# Patient Record
Sex: Female | Born: 1980 | Race: White | Hispanic: No | Marital: Married | Smoking: Never smoker
Health system: Southern US, Community
[De-identification: ages and names within clinical notes are randomized; demographics above are authoritative.]

## PROBLEM LIST (undated history)

## (undated) DIAGNOSIS — Z8619 Personal history of other infectious and parasitic diseases: Secondary | ICD-10-CM

## (undated) DIAGNOSIS — Z789 Other specified health status: Secondary | ICD-10-CM

## (undated) HISTORY — DX: Personal history of other infectious and parasitic diseases: Z86.19

## (undated) HISTORY — PX: WISDOM TOOTH EXTRACTION: SHX21

## (undated) HISTORY — PX: TONSILLECTOMY: SUR1361

---

## 2002-06-25 ENCOUNTER — Other Ambulatory Visit: Admission: RE | Admit: 2002-06-25 | Discharge: 2002-06-25 | Payer: Self-pay | Admitting: Obstetrics and Gynecology

## 2003-08-09 ENCOUNTER — Other Ambulatory Visit: Admission: RE | Admit: 2003-08-09 | Discharge: 2003-08-09 | Payer: Self-pay | Admitting: Obstetrics and Gynecology

## 2004-08-18 ENCOUNTER — Other Ambulatory Visit: Admission: RE | Admit: 2004-08-18 | Discharge: 2004-08-18 | Payer: Self-pay | Admitting: Obstetrics and Gynecology

## 2006-08-31 ENCOUNTER — Other Ambulatory Visit: Admission: RE | Admit: 2006-08-31 | Discharge: 2006-08-31 | Payer: Self-pay | Admitting: Obstetrics and Gynecology

## 2008-02-02 ENCOUNTER — Other Ambulatory Visit: Admission: RE | Admit: 2008-02-02 | Discharge: 2008-02-02 | Payer: Self-pay | Admitting: Obstetrics and Gynecology

## 2009-10-03 ENCOUNTER — Other Ambulatory Visit: Admission: RE | Admit: 2009-10-03 | Discharge: 2009-10-03 | Payer: Self-pay | Admitting: Obstetrics and Gynecology

## 2009-10-08 ENCOUNTER — Encounter: Admission: RE | Admit: 2009-10-08 | Discharge: 2009-10-08 | Payer: Self-pay | Admitting: Obstetrics and Gynecology

## 2010-01-18 NOTE — L&D Delivery Note (Signed)
Delivery Note At 7:42 PM a viable female was delivered via Vaginal, Spontaneous Delivery (Presentation: ; Occiput Anterior).  APGAR: 9, 9; weight 7 lb 5.5 oz (3330 g).   Placenta status: Intact, Spontaneous, marginal insertion of cord.  Cord: 3 vessels with the following complications: None.  Cord pH: N/a Nuchal cord, manually reduced.  Anesthesia: Epidural  Episiotomy: None Lacerations: 2nd degree;Perineal.  Vaginal avulsion 3 cm Suture Repair: 2.0 3.0 chromic Est. Blood Loss (mL): 200  Mom to postpartum.  Baby to skin to skin.  Geryl Rankins 01/15/2011, 8:20 PM

## 2010-05-13 ENCOUNTER — Inpatient Hospital Stay (HOSPITAL_COMMUNITY): Payer: PRIVATE HEALTH INSURANCE

## 2010-05-13 ENCOUNTER — Inpatient Hospital Stay (HOSPITAL_COMMUNITY)
Admission: AD | Admit: 2010-05-13 | Discharge: 2010-05-13 | Disposition: A | Discharge status: Home / Self Care | Payer: PRIVATE HEALTH INSURANCE | Source: Ambulatory Visit | Attending: Obstetrics and Gynecology | Admitting: Obstetrics and Gynecology

## 2010-05-13 DIAGNOSIS — R109 Unspecified abdominal pain: Secondary | ICD-10-CM | POA: Insufficient documentation

## 2010-05-13 DIAGNOSIS — O99891 Other specified diseases and conditions complicating pregnancy: Secondary | ICD-10-CM

## 2010-05-13 DIAGNOSIS — O9989 Other specified diseases and conditions complicating pregnancy, childbirth and the puerperium: Secondary | ICD-10-CM

## 2010-05-13 DIAGNOSIS — R52 Pain, unspecified: Secondary | ICD-10-CM

## 2010-05-13 LAB — CBC
HCT: 35 % — ABNORMAL LOW (ref 36.0–46.0)
Hemoglobin: 12 g/dL (ref 12.0–15.0)
RBC: 4 MIL/uL (ref 3.87–5.11)
WBC: 6.7 10*3/uL (ref 4.0–10.5)

## 2010-05-13 LAB — HCG, QUANTITATIVE, PREGNANCY: hCG, Beta Chain, Quant, S: 9871 m[IU]/mL — ABNORMAL HIGH (ref <5)

## 2010-05-29 LAB — RPR: RPR: NONREACTIVE

## 2010-05-29 LAB — RUBELLA ANTIBODY, IGM: Rubella: IMMUNE

## 2010-05-29 LAB — HIV ANTIBODY (ROUTINE TESTING W REFLEX): HIV: NONREACTIVE

## 2010-05-29 LAB — GC/CHLAMYDIA PROBE AMP, GENITAL
Chlamydia: NEGATIVE
Gonorrhea: NEGATIVE

## 2010-05-29 LAB — ANTIBODY SCREEN: Antibody Screen: NEGATIVE

## 2011-01-13 ENCOUNTER — Encounter (HOSPITAL_COMMUNITY): Payer: Self-pay | Admitting: *Deleted

## 2011-01-13 ENCOUNTER — Telehealth (HOSPITAL_COMMUNITY): Payer: Self-pay | Admitting: *Deleted

## 2011-01-13 NOTE — Telephone Encounter (Signed)
Preadmission screen  

## 2011-01-14 ENCOUNTER — Encounter (HOSPITAL_COMMUNITY): Payer: Self-pay | Admitting: *Deleted

## 2011-01-14 ENCOUNTER — Inpatient Hospital Stay (HOSPITAL_COMMUNITY)
Admission: AD | Admit: 2011-01-14 | Discharge: 2011-01-14 | Disposition: A | Discharge status: Home / Self Care | Payer: PRIVATE HEALTH INSURANCE | Source: Ambulatory Visit | Attending: Obstetrics and Gynecology | Admitting: Obstetrics and Gynecology

## 2011-01-14 DIAGNOSIS — O479 False labor, unspecified: Secondary | ICD-10-CM | POA: Insufficient documentation

## 2011-01-14 HISTORY — DX: Other specified health status: Z78.9

## 2011-01-14 MED ORDER — ZOLPIDEM TARTRATE 10 MG PO TABS
10.0000 mg | ORAL_TABLET | Freq: Once | ORAL | Status: AC
  Administered 2011-01-14: 10 mg via ORAL
  Filled 2011-01-14: qty 1

## 2011-01-14 NOTE — Progress Notes (Signed)
Pt states she has been contraction since 01/13/11 @ 0400

## 2011-01-14 NOTE — Progress Notes (Signed)
Contractions q3-4 minutes. Denies bleeding or ROM

## 2011-01-15 ENCOUNTER — Encounter (HOSPITAL_COMMUNITY): Payer: Self-pay | Admitting: *Deleted

## 2011-01-15 ENCOUNTER — Encounter (HOSPITAL_COMMUNITY): Payer: Self-pay | Admitting: Anesthesiology

## 2011-01-15 ENCOUNTER — Inpatient Hospital Stay (HOSPITAL_COMMUNITY): Payer: PRIVATE HEALTH INSURANCE | Admitting: Anesthesiology

## 2011-01-15 ENCOUNTER — Inpatient Hospital Stay (HOSPITAL_COMMUNITY)
Admission: AD | Admit: 2011-01-15 | Discharge: 2011-01-17 | DRG: 775 | Disposition: A | Discharge status: Home / Self Care | Payer: PRIVATE HEALTH INSURANCE | Source: Ambulatory Visit | Attending: Obstetrics and Gynecology | Admitting: Obstetrics and Gynecology

## 2011-01-15 LAB — RPR: RPR Ser Ql: NONREACTIVE

## 2011-01-15 LAB — CBC
MCH: 31.3 pg (ref 26.0–34.0)
MCHC: 34.9 g/dL (ref 30.0–36.0)
Platelets: 212 10*3/uL (ref 150–400)
RDW: 13 % (ref 11.5–15.5)

## 2011-01-15 MED ORDER — LACTATED RINGERS IV SOLN: 500.0000 mL | INTRAVENOUS | Status: DC | PRN

## 2011-01-15 MED ORDER — LACTATED RINGERS IV SOLN
INTRAVENOUS | Status: DC
  Administered 2011-01-15: 125 mL/h via INTRAVENOUS

## 2011-01-15 MED ORDER — SODIUM BICARBONATE 8.4 % IV SOLN
INTRAVENOUS | Status: DC | PRN
  Administered 2011-01-15: 5 mL via EPIDURAL

## 2011-01-15 MED ORDER — EPHEDRINE 5 MG/ML INJ
10.0000 mg | INTRAVENOUS | Status: DC | PRN
  Filled 2011-01-15: qty 4

## 2011-01-15 MED ORDER — LACTATED RINGERS IV SOLN
500.0000 mL | Freq: Once | INTRAVENOUS | Status: AC
  Administered 2011-01-15: 500 mL via INTRAVENOUS

## 2011-01-15 MED ORDER — OXYTOCIN 20 UNITS IN LACTATED RINGERS INFUSION - SIMPLE
1.0000 m[IU]/min | INTRAVENOUS | Status: DC
  Administered 2011-01-15: 1 m[IU]/min via INTRAVENOUS
  Filled 2011-01-15: qty 1000

## 2011-01-15 MED ORDER — OXYTOCIN BOLUS FROM INFUSION
500.0000 mL | Freq: Once | INTRAVENOUS | Status: DC
  Filled 2011-01-15: qty 500

## 2011-01-15 MED ORDER — PHENYLEPHRINE 40 MCG/ML (10ML) SYRINGE FOR IV PUSH (FOR BLOOD PRESSURE SUPPORT): 80.0000 ug | PREFILLED_SYRINGE | INTRAVENOUS | Status: DC | PRN

## 2011-01-15 MED ORDER — LIDOCAINE-EPINEPHRINE (PF) 2 %-1:200000 IJ SOLN
INTRAMUSCULAR | Status: AC
  Filled 2011-01-15: qty 20

## 2011-01-15 MED ORDER — ONDANSETRON HCL 4 MG/2ML IJ SOLN
4.0000 mg | Freq: Four times a day (QID) | INTRAMUSCULAR | Status: DC | PRN
  Administered 2011-01-15: 4 mg via INTRAVENOUS
  Filled 2011-01-15: qty 2

## 2011-01-15 MED ORDER — IBUPROFEN 600 MG PO TABS
600.0000 mg | ORAL_TABLET | Freq: Four times a day (QID) | ORAL | Status: DC | PRN
  Administered 2011-01-15: 600 mg via ORAL
  Filled 2011-01-15: qty 1

## 2011-01-15 MED ORDER — LIDOCAINE HCL 1.5 % IJ SOLN
INTRAMUSCULAR | Status: DC | PRN
  Administered 2011-01-15 (×2): 5 mL via EPIDURAL

## 2011-01-15 MED ORDER — LIDOCAINE HCL (PF) 1 % IJ SOLN
30.0000 mL | INTRAMUSCULAR | Status: DC | PRN
  Filled 2011-01-15: qty 30

## 2011-01-15 MED ORDER — TERBUTALINE SULFATE 1 MG/ML IJ SOLN: 0.2500 mg | Freq: Once | INTRAMUSCULAR | Status: DC | PRN

## 2011-01-15 MED ORDER — DIPHENHYDRAMINE HCL 50 MG/ML IJ SOLN: 12.5000 mg | INTRAMUSCULAR | Status: DC | PRN

## 2011-01-15 MED ORDER — FENTANYL 2.5 MCG/ML BUPIVACAINE 1/10 % EPIDURAL INFUSION (WH - ANES)
INTRAMUSCULAR | Status: DC | PRN
  Administered 2011-01-15: 14 mL/h via EPIDURAL

## 2011-01-15 MED ORDER — FENTANYL 2.5 MCG/ML BUPIVACAINE 1/10 % EPIDURAL INFUSION (WH - ANES)
14.0000 mL/h | INTRAMUSCULAR | Status: DC
  Administered 2011-01-15 (×2): 14 mL/h via EPIDURAL
  Filled 2011-01-15 (×3): qty 60

## 2011-01-15 MED ORDER — OXYTOCIN 20 UNITS IN LACTATED RINGERS INFUSION - SIMPLE
125.0000 mL/h | Freq: Once | INTRAVENOUS | Status: AC
  Administered 2011-01-15: 125 mL/h via INTRAVENOUS

## 2011-01-15 MED ORDER — CITRIC ACID-SODIUM CITRATE 334-500 MG/5ML PO SOLN: 30.0000 mL | ORAL | Status: DC | PRN

## 2011-01-15 MED ORDER — ACETAMINOPHEN 325 MG PO TABS: 650.0000 mg | ORAL_TABLET | ORAL | Status: DC | PRN

## 2011-01-15 MED ORDER — OXYCODONE-ACETAMINOPHEN 5-325 MG PO TABS: 2.0000 | ORAL_TABLET | ORAL | Status: DC | PRN

## 2011-01-15 MED ORDER — BUTORPHANOL TARTRATE 2 MG/ML IJ SOLN: 1.0000 mg | INTRAMUSCULAR | Status: DC | PRN

## 2011-01-15 MED ORDER — EPHEDRINE 5 MG/ML INJ: 10.0000 mg | INTRAVENOUS | Status: DC | PRN

## 2011-01-15 MED ORDER — FLEET ENEMA 7-19 GM/118ML RE ENEM: 1.0000 | ENEMA | RECTAL | Status: DC | PRN

## 2011-01-15 MED ORDER — PHENYLEPHRINE 40 MCG/ML (10ML) SYRINGE FOR IV PUSH (FOR BLOOD PRESSURE SUPPORT)
80.0000 ug | PREFILLED_SYRINGE | INTRAVENOUS | Status: DC | PRN
  Filled 2011-01-15: qty 5

## 2011-01-15 NOTE — H&P (Signed)
Kathryn Wu is a 30 y.o. female G1 at 53 4/7 weeks confirmed by a 7 weeks ultrasound with c/o SROM at 0720, clear.  Pt in latent labor since 01/13/2011 at 0400.  Cervical exam yesterday was 1/90/-2.  Upon arrival, rupture confirmed and cervical exam was 3/90/-2.  PNC complicated by multiple episodes of SOB with palptiations.  A cardiac evaluation done with Holter monitor was reassuring and did not reveal clear etiology.  Pt has not had any major episodes recently. Pt reports fetal movement, no vaginal bleeding.  Pt comfortable s/p epidural. Maternal Medical History:  Reason for admission: Reason for admission: rupture of membranes and contractions.  Reason for Admission:   nauseaContractions: Onset was more than 2 days ago.   Frequency: irregular.   Perceived severity is strong.    Fetal activity: Perceived fetal activity is normal.    Prenatal complications: No bleeding or pre-eclampsia.     OB History    Grav Para Term Preterm Abortions TAB SAB Ect Mult Living   1              Past Medical History  Diagnosis Date  . History of chicken pox   . No pertinent past medical history    Past Surgical History  Procedure Date  . Tonsillectomy   . Wisdom tooth extraction    Family History: family history includes Asthma in her brother; Cancer in her maternal grandfather, maternal grandmother, maternal uncle, mother, and sister; Diabetes in her maternal grandfather; Hyperlipidemia in her father; Hypertension in her father; Miscarriages / [place] in her sister; and Vision loss in her mother. Social History:  reports that she has never smoked. She has never used smokeless tobacco. She reports that she does not drink alcohol or use illicit drugs.  Review of Systems  Cardiovascular: Negative for palpitations.  Gastrointestinal: Positive for abdominal pain. Negative for nausea and vomiting.  Genitourinary:       LOF, no vaginal bleeding   Cvx unchanged except 0 station. Dilation:  5 Effacement (%): 90 Station: -1 Exam by:: felkelrn Blood pressure 132/90, pulse 87, temperature 98.1 F (36.7 C), temperature source Oral, resp. rate 18, height 5\' 3"  (1.6 m), weight 73.301 kg (161 lb 9.6 oz), SpO2 100.00%. Maternal Exam:  Uterine Assessment: Contraction frequency is regular.   Abdomen: Patient reports no abdominal tenderness. Estimated fetal weight is 6 1/2 pounds.   Fetal presentation: vertex  Introitus: Normal vulva. Amniotic fluid character: clear.  Pelvis: adequate for delivery.   Cervix: Cervix evaluated by digital exam.     Fetal Exam Fetal Monitor Review: Mode: fetoscope.   Baseline rate: 130s.  Variability: moderate (6-25 bpm).   Pattern: accelerations present.    Fetal State Assessment: Category I - tracings are normal.     Physical Exam  Constitutional: She is oriented to person, place, and time. She appears well-developed and well-nourished.  HENT:  Head: Normocephalic and atraumatic.  Eyes: Conjunctivae and EOM are normal.  Neck: Normal range of motion.  GI:       No fundal tenderness.  Genitourinary: Vagina normal and uterus normal.  Musculoskeletal: She exhibits no edema and no tenderness.  Neurological: She is alert and oriented to person, place, and time.  Skin: Skin is warm and dry.  Psychiatric: She has a normal mood and affect.    Prenatal labs: ABO, Rh: --/--/A POS (04/25 0800) Antibody: Negative (05/11 0000) Rubella: Immune (05/11 0000) RPR: Nonreactive (05/11 0000)  HBsAg: Negative (05/11 0000)  HIV: Non-reactive (05/11 0000)  GBS: Negative (11/28 0000)   Assessment/Plan: Active labor at Term. Protracted labor with SROM.  Will start Pitocin. Fetal status very reassuring.   Dinh Ayotte 01/15/2011, 1:17 PM

## 2011-01-15 NOTE — Anesthesia Preprocedure Evaluation (Signed)

## 2011-01-15 NOTE — Anesthesia Procedure Notes (Signed)
Epidural Patient location during procedure: OB Start time: 01/15/2011 11:24 AM End time: 01/15/2011 11:30 AM  Staffing Anesthesiologist: Sandrea Hughs Performed by: anesthesiologist   Preanesthetic Checklist Completed: patient identified, site marked, surgical consent, pre-op evaluation, timeout performed, IV checked, risks and benefits discussed and monitors and equipment checked  Epidural Patient position: sitting Prep: site prepped and draped and DuraPrep Patient monitoring: continuous pulse ox and blood pressure Approach: midline Injection technique: LOR air  Needle:  Needle type: Tuohy  Needle gauge: 17 G Needle length: 9 cm Needle insertion depth: 5 cm cm Catheter type: closed end flexible Catheter size: 19 Gauge Catheter at skin depth: 10 cm Test dose: negative and 1.5% lidocaine  Assessment Sensory level: T10 Events: blood not aspirated, injection not painful, no injection resistance, negative IV test and no paresthesia

## 2011-01-15 NOTE — Progress Notes (Signed)
Patient states she had a big gush of clear fluid at 0720, continues to have a small amount of leaking with contractions every 5-6 minutes. Reports good fetal movement.

## 2011-01-16 ENCOUNTER — Inpatient Hospital Stay (HOSPITAL_COMMUNITY): Payer: PRIVATE HEALTH INSURANCE

## 2011-01-16 LAB — CBC
HCT: 31.6 % — ABNORMAL LOW (ref 36.0–46.0)
Hemoglobin: 10.9 g/dL — ABNORMAL LOW (ref 12.0–15.0)
MCHC: 34.5 g/dL (ref 30.0–36.0)
MCV: 89.3 fL (ref 78.0–100.0)
WBC: 15.2 10*3/uL — ABNORMAL HIGH (ref 4.0–10.5)

## 2011-01-16 MED ORDER — OXYTOCIN 20 UNITS IN LACTATED RINGERS INFUSION - SIMPLE: 125.0000 mL/h | INTRAVENOUS | Status: DC | PRN

## 2011-01-16 MED ORDER — ONDANSETRON HCL 4 MG/2ML IJ SOLN: 4.0000 mg | INTRAMUSCULAR | Status: DC | PRN

## 2011-01-16 MED ORDER — SENNOSIDES-DOCUSATE SODIUM 8.6-50 MG PO TABS
2.0000 | ORAL_TABLET | Freq: Every day | ORAL | Status: DC
  Administered 2011-01-16: 2 via ORAL

## 2011-01-16 MED ORDER — METHYLERGONOVINE MALEATE 0.2 MG/ML IJ SOLN: 0.2000 mg | INTRAMUSCULAR | Status: DC | PRN

## 2011-01-16 MED ORDER — LANOLIN HYDROUS EX OINT: TOPICAL_OINTMENT | CUTANEOUS | Status: DC | PRN

## 2011-01-16 MED ORDER — MAGNESIUM HYDROXIDE 400 MG/5ML PO SUSP: 30.0000 mL | ORAL | Status: DC | PRN

## 2011-01-16 MED ORDER — OXYCODONE-ACETAMINOPHEN 5-325 MG PO TABS
1.0000 | ORAL_TABLET | ORAL | Status: DC | PRN
  Administered 2011-01-16 (×2): 1 via ORAL
  Filled 2011-01-16 (×2): qty 1

## 2011-01-16 MED ORDER — METHYLERGONOVINE MALEATE 0.2 MG PO TABS: 0.2000 mg | ORAL_TABLET | ORAL | Status: DC | PRN

## 2011-01-16 MED ORDER — ZOLPIDEM TARTRATE 5 MG PO TABS: 5.0000 mg | ORAL_TABLET | Freq: Every evening | ORAL | Status: DC | PRN

## 2011-01-16 MED ORDER — BENZOCAINE-MENTHOL 20-0.5 % EX AERO
INHALATION_SPRAY | CUTANEOUS | Status: AC
  Administered 2011-01-16: 1 via TOPICAL
  Filled 2011-01-16: qty 56

## 2011-01-16 MED ORDER — TETANUS-DIPHTH-ACELL PERTUSSIS 5-2.5-18.5 LF-MCG/0.5 IM SUSP: 0.5000 mL | Freq: Once | INTRAMUSCULAR | Status: DC

## 2011-01-16 MED ORDER — CALCIUM CARBONATE ANTACID 500 MG PO CHEW: 1.0000 | CHEWABLE_TABLET | Freq: Every day | ORAL | Status: DC | PRN

## 2011-01-16 MED ORDER — DIBUCAINE 1 % RE OINT
1.0000 "application " | TOPICAL_OINTMENT | RECTAL | Status: DC | PRN
  Administered 2011-01-16: 1 via RECTAL
  Filled 2011-01-16 (×2): qty 28

## 2011-01-16 MED ORDER — IBUPROFEN 600 MG PO TABS
600.0000 mg | ORAL_TABLET | Freq: Four times a day (QID) | ORAL | Status: DC
  Administered 2011-01-16 – 2011-01-17 (×5): 600 mg via ORAL
  Filled 2011-01-16 (×6): qty 1

## 2011-01-16 MED ORDER — MEASLES, MUMPS & RUBELLA VAC ~~LOC~~ INJ
0.5000 mL | INJECTION | Freq: Once | SUBCUTANEOUS | Status: DC
  Filled 2011-01-16: qty 0.5

## 2011-01-16 MED ORDER — WITCH HAZEL-GLYCERIN EX PADS
1.0000 "application " | MEDICATED_PAD | CUTANEOUS | Status: DC | PRN
  Administered 2011-01-16: 1 via TOPICAL

## 2011-01-16 MED ORDER — BENZOCAINE-MENTHOL 20-0.5 % EX AERO
1.0000 "application " | INHALATION_SPRAY | CUTANEOUS | Status: DC | PRN
  Administered 2011-01-16: 1 via TOPICAL

## 2011-01-16 MED ORDER — PRENATAL MULTIVITAMIN CH
1.0000 | ORAL_TABLET | Freq: Every day | ORAL | Status: DC
  Administered 2011-01-16 – 2011-01-17 (×2): 1 via ORAL
  Filled 2011-01-16 (×2): qty 1

## 2011-01-16 MED ORDER — DIPHENHYDRAMINE HCL 25 MG PO CAPS: 25.0000 mg | ORAL_CAPSULE | Freq: Four times a day (QID) | ORAL | Status: DC | PRN

## 2011-01-16 MED ORDER — ONDANSETRON HCL 4 MG PO TABS: 4.0000 mg | ORAL_TABLET | ORAL | Status: DC | PRN

## 2011-01-16 MED ORDER — SIMETHICONE 80 MG PO CHEW: 80.0000 mg | CHEWABLE_TABLET | ORAL | Status: DC | PRN

## 2011-01-16 NOTE — Progress Notes (Signed)
C/o tenderness to sternum. States that it wasn't protruding like it is now. Tender to touch. Edema to tissue around sternum noted. Marked with pin.

## 2011-01-16 NOTE — Progress Notes (Signed)
PPD 1 s/p SVD/2nd degree perineal laceration and vaginal laceration.  Patient c/o pain around her xiphoid.  She states that during labor, she had several episodes of nausea and vomiting and after delivery, noted increased pain and swelling of the area surrounding the xiphoid.  No other complaints.  Tolerating regular diet.  Pain is controlled otherwise.  Vaginal bleeding decreased.    Afebrile; BP's somewhat labile with occasional diastolics in 90's.  Otherwise vss. Chest:  + mild-mod ttp of the xiphoid.  Difficult to determine change in position of xiphoid given this is first time I have seen her but it appears that there is some degree of protrusion of the xiphoid outward.  Nurse apparently circled area around xiphoid to indicate swelling, but this washed off with shower.  Induration appears to have resolved. Abd:  Fundus firm, below umbilicus. Lower Ext:  homan's neg.  No c/c/e.  No evidence of DVT.  Postpartum Crit:  31.  RPR NR.  RI.  A+.  A/P:  Stable ppd1.  Will continue to monitor BP's to ensure normalization.  Obtain CXR (2 views) to evaluate xiphoid.  Consider ortho consult.

## 2011-01-16 NOTE — Anesthesia Postprocedure Evaluation (Signed)
  Anesthesia Post-op Note  Patient: Kathryn Wu  Procedure(s) Performed: * No procedures listed *  Patient Location: Mother/Baby  Anesthesia Type: Epidural  Level of Consciousness: alert  and oriented  Airway and Oxygen Therapy: Patient Spontanous Breathing  Post-op Pain: mild  Post-op Assessment: Patient's Cardiovascular Status Stable and Respiratory Function Stable  Post-op Vital Signs: stable  Complications: No apparent anesthesia complications

## 2011-01-17 MED ORDER — BENZOCAINE-MENTHOL 20-0.5 % EX AERO
INHALATION_SPRAY | CUTANEOUS | Status: AC
  Filled 2011-01-17: qty 56

## 2011-01-17 MED ORDER — IBUPROFEN 600 MG PO TABS: 600.0000 mg | ORAL_TABLET | Freq: Four times a day (QID) | ORAL | Status: AC

## 2011-01-17 NOTE — Progress Notes (Signed)
PPD#2:  Patient states that her pain is controlled and is having decreased bleeding.  She had a bowel movement today.  She states that her pain around her sternum is improved from yesterday.    AFVSS.  BP's have normalized. Chest:  Swelling around xiphoid process has decreased.  Mildly tender to palpation.  When lying supine, there is a visible protrusion in the area of the xiphoid.   Abd:  Fundus below umbilicus; firm.  Otherwise, soft, nontender.   LE's:  homan's neg.  No other evid of dvt.  CXR:  No change in osseous structures on PA/LAT CXR.  A/P:  PPD 2 s/p svd.  Stable.  Ready for discharge.  Patient will be given rx for motrin 600 mg.  Pt will decide on contraceptive method at 6 wk appt.  Pt to continue with home colace and pnv, latter as long as breastfeeding.    Sternum/Xiphoid:  Clinical exam today clearly slight shows protrusion of the xiphoid process, now that swelling has diminished.  I discussed this with Dr. Jillyn Hidden of [facility].  He had never heard of such a condition and had no specific recommendations other than considering CT or contacting cardiothoracic surgery.  I spoke with Dr. Tyrone Sage, on call for cardiothoracic surgery, who also had not heard of such a condition.  However, he stated that based on the info I provided it was possibly a dislocation of the cartilaginous structures in this area and that with time, patient's clinical findings might normalize.  He recommended no specific intervention at this time.  He stated that if patient continued to have symptoms of pain and tenderness that she could follow-up with him as an outpatient.  The patient is amenable to this plan.  I discussed with patient potential complications of fx of xiphoid (mostly associated with inward fx from CPR) of liver lac or rupture of diaphragm.  If patient begins to have upper abd pain, feels faint, or has shortness of breath or chest pain, etc, patient is to return to hospital.  These findings  and plan will be discussed with Dr. Dion Body upon signout on Monday morning.

## 2011-01-17 NOTE — Progress Notes (Signed)
D/c summary dictated:  [id number]

## 2011-01-17 NOTE — Discharge Summary (Signed)
[name], CHAMPAGNE NO.:  [id number]  MEDICAL RECORD NO.:  [id number]  LOCATION:  9124                          FACILITY:  [facility]  PHYSICIAN:  Pricilla Holm, MD      DATE OF BIRTH:  June 22, 1980  DATE OF ADMISSION:  01/15/2011 DATE OF DISCHARGE:  01/17/2011                              DISCHARGE SUMMARY   PRINCIPAL DIAGNOSIS:  Pregnancy at 40 weeks and 4 days, status post vaginal delivery, protrusion of xiphoid process outward, status post vaginal delivery.  HISTORY OF PRESENT ILLNESS:  Please see dictated H and P for further details.  The patient was admitted on January 15, 2011, at 40 weeks and 4 days gestation with a chief complaint of continuing uterine contractions and ruptured membranes.  Rupture of membranes was confirmed and the patient was noted to be 3 cm on presentation.  The patient was admitted for routine labor.  She was GBS negative.  Other relevant labs include blood type of A positive, rubella immune, RPR nonreactive, hepatitis B negative, and HIV negative.  Reassuring fetal status was noted on admission.  The patient proceeded through labor without complication. The patient did have an epidural placed during her labor.  The patient delivered a viable infant female with Apgars of 9 and 9.  Please see delivery note.  Delivery was also significant for a second-degree perineal laceration with a 3-cm vaginal avulsion.  The patient's postpartum recovery was significant for her complaint of tenderness at the inferior sternum.  On postpartum day #1, this was evaluated and the patient was noted to have mild-to-moderate tenderness to palpation as well as a slight protrusion of the xiphoid process.  There was some soft tissue swelling around this area.  Otherwise, on postpartum day #1, the patient's blood pressures were somewhat labile with occasional diastolics in the 90s.  Otherwise, vitals were fine and the remainder of her exam was unremarkable.  The  patient's postpartum hematocrit was noted to be 31.  The assessment and plan at that time was to continue to evaluate blood pressures for normalization as well as to obtain a chest x-ray to evaluate the xiphoid and consider ortho consultation.  On postpartum day #2, the patient's swelling around the xiphoid had decreased even more leading to a visible albeit slight protrusion of the xiphoid process when the patient was laying supine.  Given this, even though the patient stated that her tenderness in the area had improved, I discussed these findings with the orthopedic surgeon, Dr. Shelle Iron, as well as with Dr. Tyrone Sage of Cardiothoracic Surgery.  Dr. Tyrone Sage recommended no specific intervention at this time.  He did say that if after several days, the patient continues to have issues such as this, she may call for an outpatient appointment.  I discussed this with the patient and the patient was amenable to this plan.  The patient was also given precautions to avoid any type of activity such as lifting, which would potentially lead to trauma of that area.  She was also instructed to return to the hospital for regular postpartum concerns such as fever, foul-smelling vaginal discharge, abdominal pain, etc., but also specifically to the xiphoid issue, the  patient was to return if she has shortness of breath, chest pain, upper abdominal pain, or if she feels faint, etc.  The patient was given prescriptions for ibuprofen.  She and I discussed contraception in the breastfeeding patient.  She stated that she will decide what contraception she would like at her 6-week postpartum visit with Dr. Dion Body.  The patient already has a scheduled appointment with Dr. Dion Body.  The patient was given the contact information for Dr. Tyrone Sage should she have continued signs and symptoms.          ______________________________ Pricilla Holm, MD     RB/MEDQ  D:  01/17/2011  T:  01/17/2011  Job:   [id number]

## 2011-01-21 ENCOUNTER — Inpatient Hospital Stay (HOSPITAL_COMMUNITY): Admission: RE | Admit: 2011-01-21 | Payer: PRIVATE HEALTH INSURANCE | Source: Ambulatory Visit

## 2011-02-22 ENCOUNTER — Other Ambulatory Visit (HOSPITAL_COMMUNITY)
Admission: RE | Admit: 2011-02-22 | Discharge: 2011-02-22 | Disposition: A | Discharge status: Home / Self Care | Payer: PRIVATE HEALTH INSURANCE | Source: Ambulatory Visit | Attending: Obstetrics and Gynecology | Admitting: Obstetrics and Gynecology

## 2011-02-22 ENCOUNTER — Other Ambulatory Visit: Payer: Self-pay | Admitting: Obstetrics and Gynecology

## 2011-02-22 DIAGNOSIS — Z01419 Encounter for gynecological examination (general) (routine) without abnormal findings: Secondary | ICD-10-CM | POA: Insufficient documentation

## 2012-02-12 IMAGING — CR DG CHEST 2V SAME DAY
3 series · 3 of 3 positions shown · non-contrast
Comparison: None.

CLINICAL DATA: Tenderness.  Protrusion of xiphoid process following
vaginal delivery.  Multiple episodes of nausea and vomiting.

CHEST - 2 VIEW SAME DAY

[view not recorded (1 of 3)]
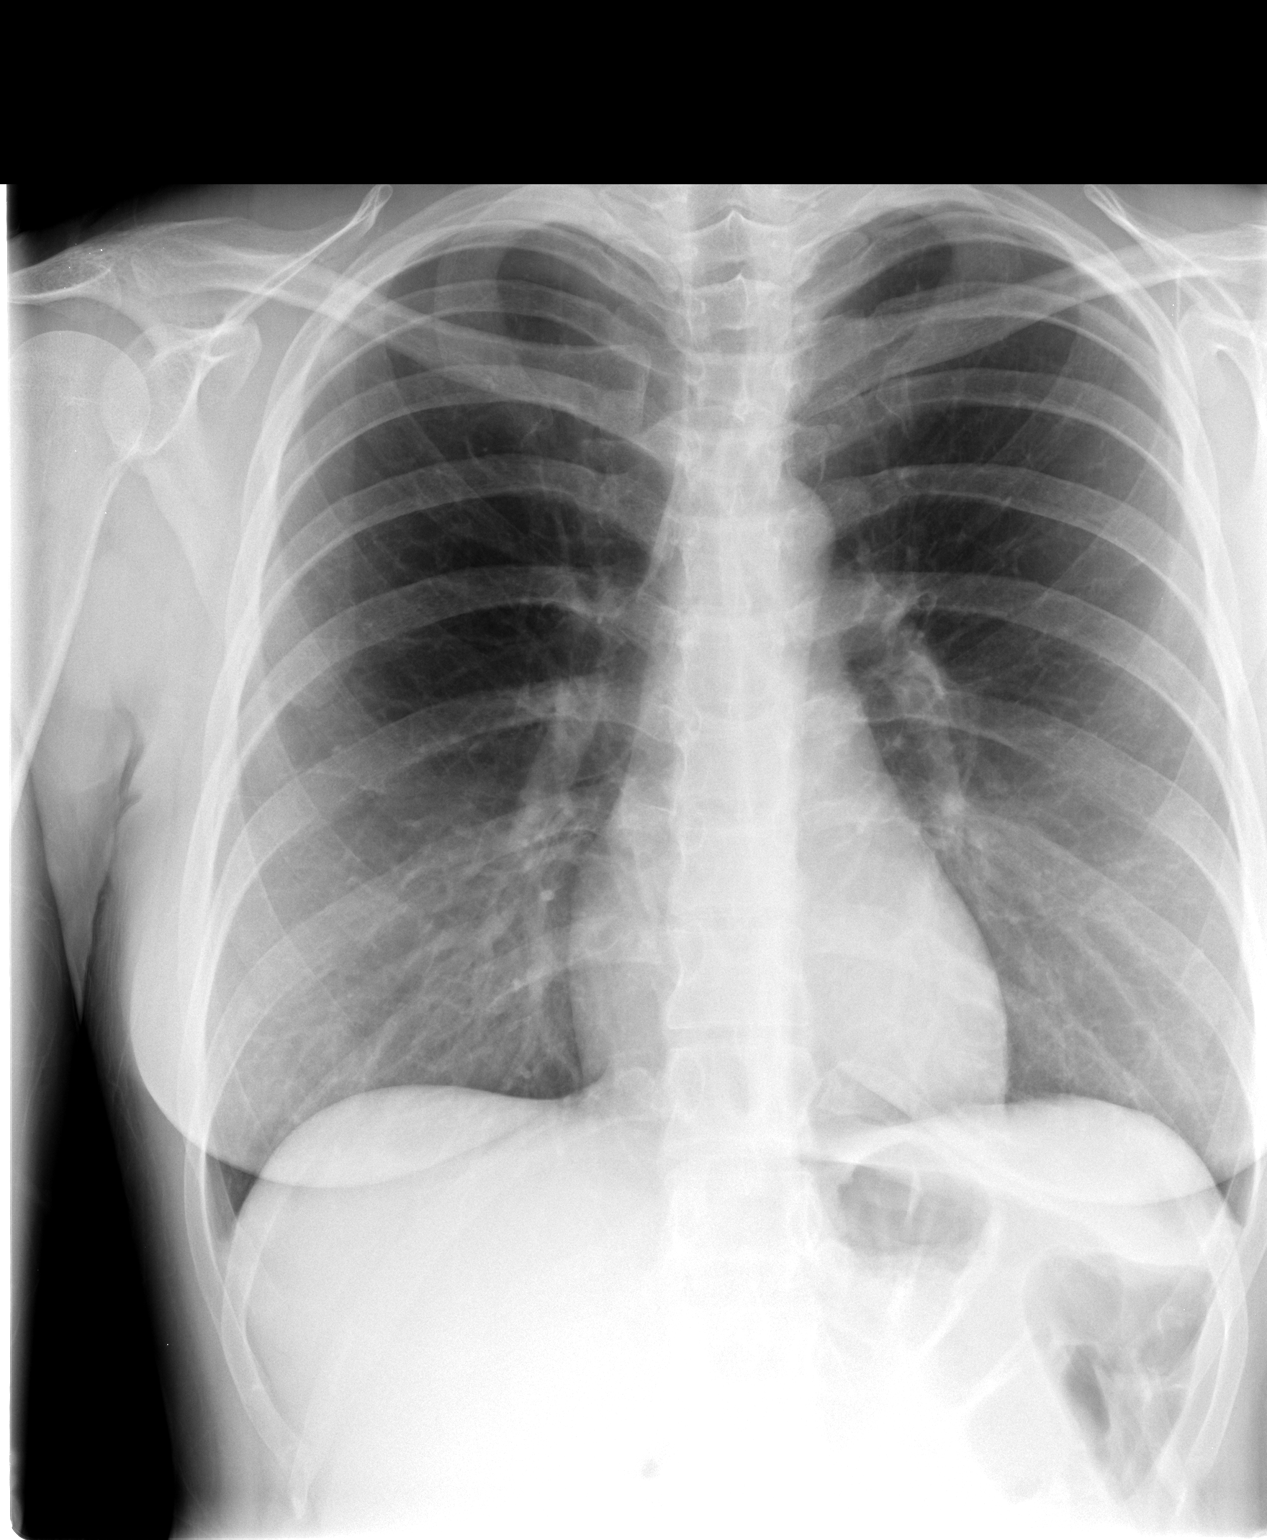

[view not recorded (2 of 3)]
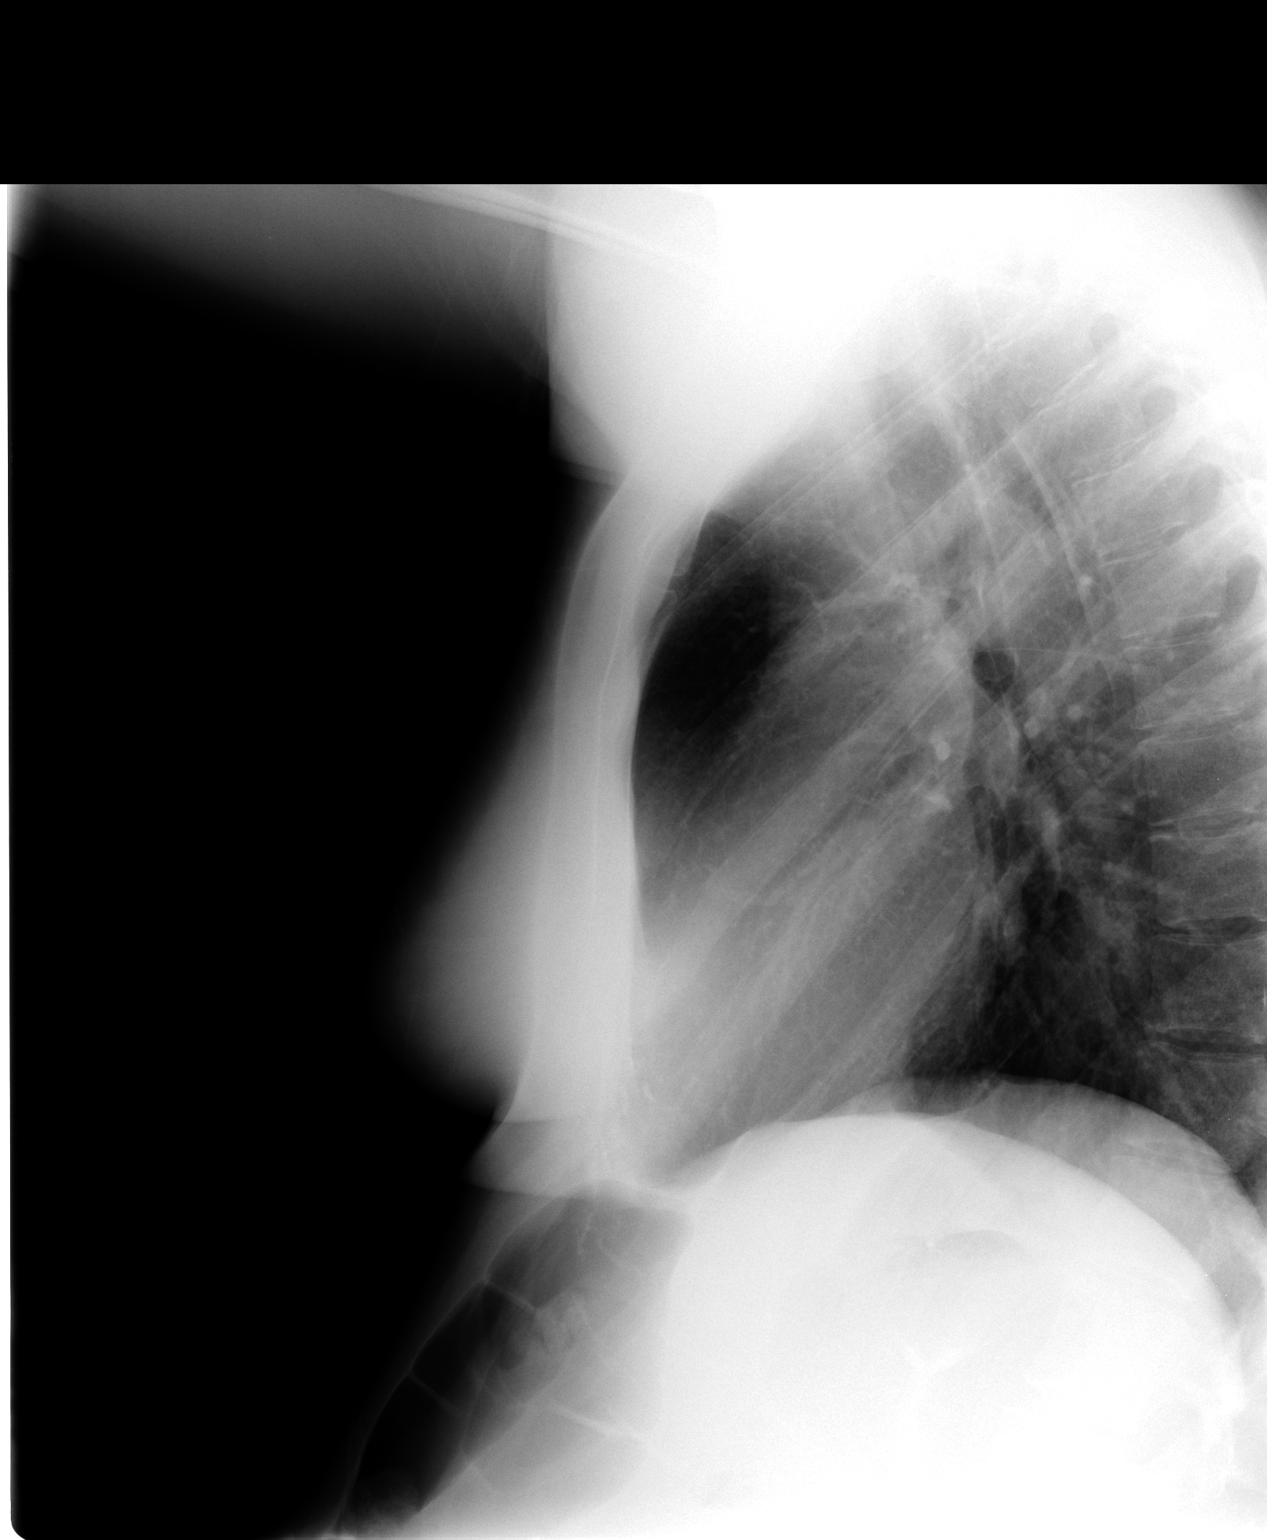

[view not recorded (3 of 3)]
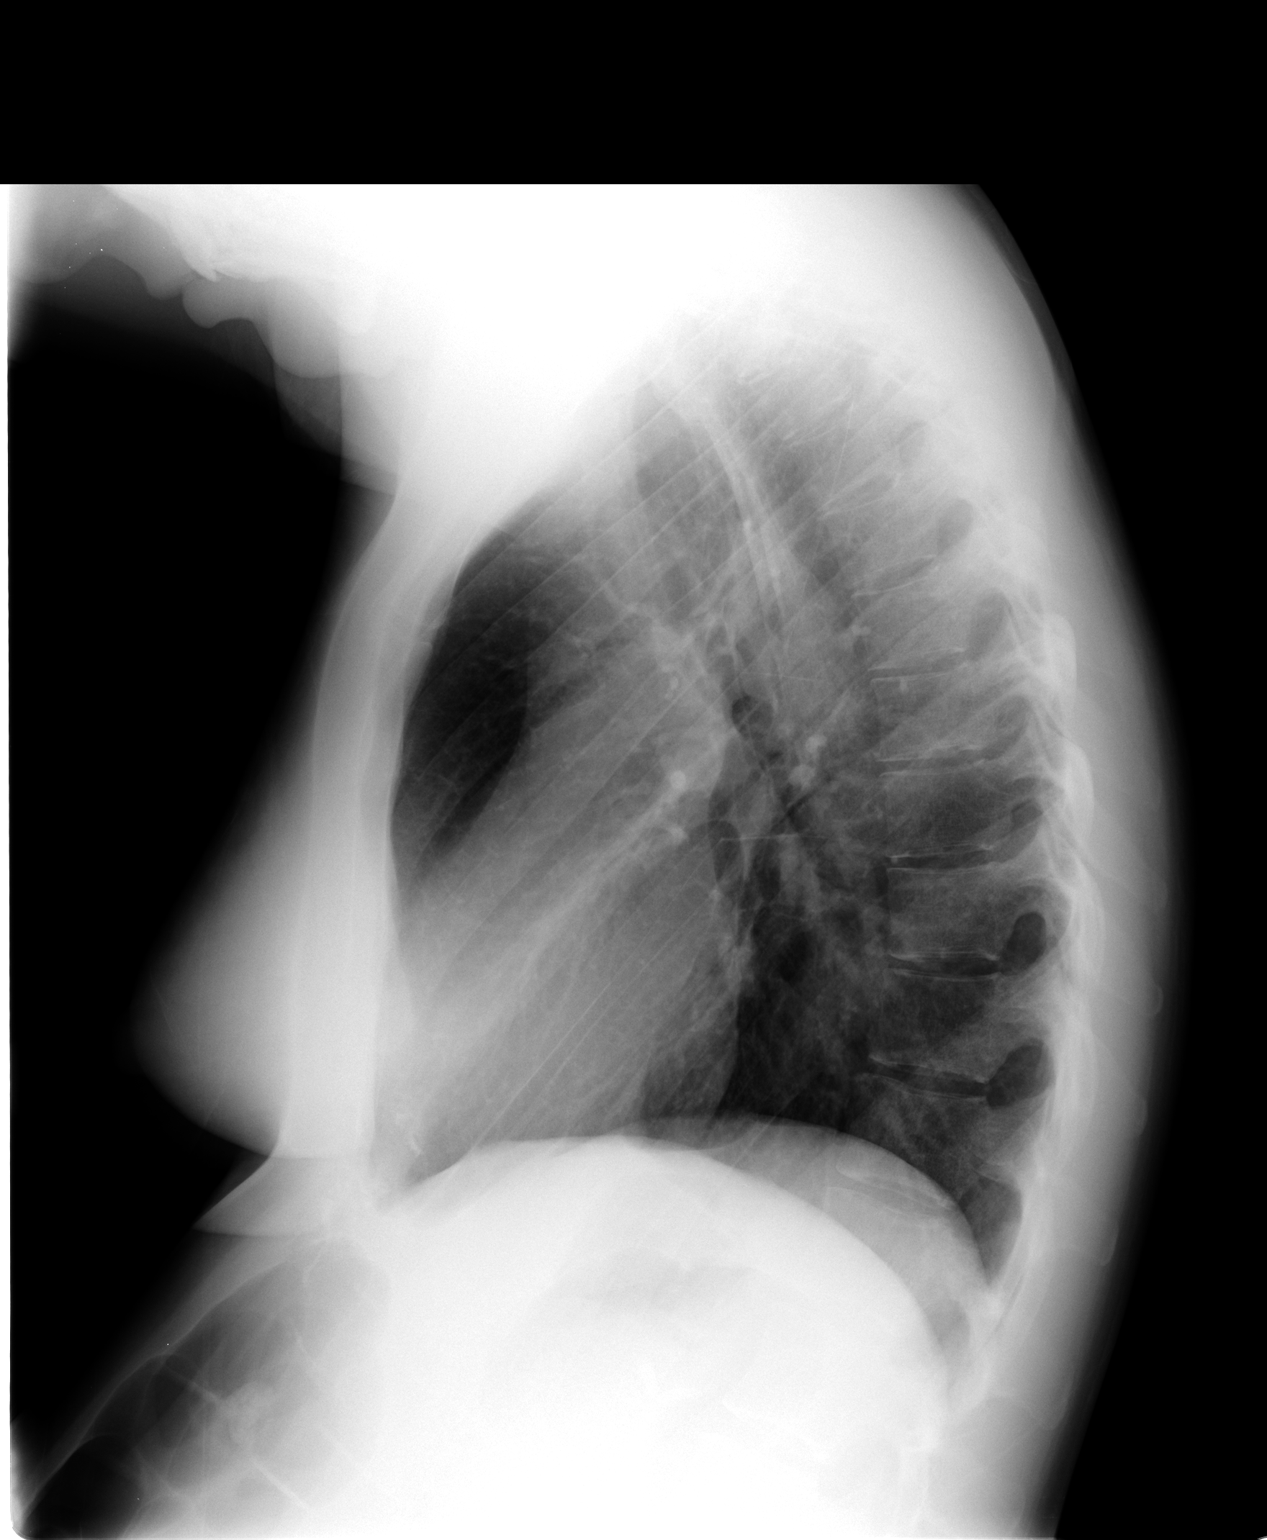

[3 of 3 positions shown; findings below may reference images not displayed]

FINDINGS: Cardiomediastinal silhouette is within normal limits.
Lungs are free of focal consolidations and pleural effusions.  No
pulmonary edema. Visualized osseous structures have a normal
appearance.
IMPRESSION: Negative exam.

## 2012-02-23 ENCOUNTER — Other Ambulatory Visit: Payer: Self-pay | Admitting: Obstetrics and Gynecology

## 2012-02-23 ENCOUNTER — Other Ambulatory Visit (HOSPITAL_COMMUNITY)
Admission: RE | Admit: 2012-02-23 | Discharge: 2012-02-23 | Disposition: A | Discharge status: Home / Self Care | Payer: BC Managed Care – PPO | Source: Ambulatory Visit | Attending: Obstetrics and Gynecology | Admitting: Obstetrics and Gynecology

## 2012-02-23 DIAGNOSIS — Z01419 Encounter for gynecological examination (general) (routine) without abnormal findings: Secondary | ICD-10-CM | POA: Insufficient documentation

## 2012-02-23 DIAGNOSIS — Z1151 Encounter for screening for human papillomavirus (HPV): Secondary | ICD-10-CM | POA: Insufficient documentation

## 2013-02-19 ENCOUNTER — Other Ambulatory Visit: Payer: Self-pay | Admitting: Family Medicine

## 2013-02-19 DIAGNOSIS — Z803 Family history of malignant neoplasm of breast: Secondary | ICD-10-CM

## 2013-02-19 DIAGNOSIS — Z1231 Encounter for screening mammogram for malignant neoplasm of breast: Secondary | ICD-10-CM

## 2013-03-09 ENCOUNTER — Ambulatory Visit
Admission: RE | Admit: 2013-03-09 | Discharge: 2013-03-09 | Disposition: A | Discharge status: Home / Self Care | Payer: BC Managed Care – PPO | Source: Ambulatory Visit | Attending: Family Medicine | Admitting: Family Medicine

## 2013-03-09 DIAGNOSIS — Z803 Family history of malignant neoplasm of breast: Secondary | ICD-10-CM

## 2013-03-09 DIAGNOSIS — Z1231 Encounter for screening mammogram for malignant neoplasm of breast: Secondary | ICD-10-CM

## 2013-03-12 ENCOUNTER — Other Ambulatory Visit: Payer: Self-pay | Admitting: Family Medicine

## 2013-03-12 DIAGNOSIS — N6310 Unspecified lump in the right breast, unspecified quadrant: Secondary | ICD-10-CM

## 2013-03-22 ENCOUNTER — Ambulatory Visit
Admission: RE | Admit: 2013-03-22 | Discharge: 2013-03-22 | Disposition: A | Discharge status: Home / Self Care | Payer: BC Managed Care – PPO | Source: Ambulatory Visit | Attending: Family Medicine | Admitting: Family Medicine

## 2013-03-22 DIAGNOSIS — N6310 Unspecified lump in the right breast, unspecified quadrant: Secondary | ICD-10-CM

## 2013-03-23 ENCOUNTER — Telehealth: Payer: Self-pay | Admitting: Genetic Counselor

## 2013-03-23 NOTE — Telephone Encounter (Signed)
S/W PT AND GAVE GENETIC APPT FOR 05/14 @ 10 W/KAREN POWELL.  WELCOME PACKET MAILED.

## 2013-03-23 NOTE — Telephone Encounter (Signed)
LEFT MESSAGE FOR PATIENT TO RETURN CALL TO SCHEDULE GENETIC APPT.  °

## 2013-05-14 ENCOUNTER — Telehealth: Payer: Self-pay | Admitting: *Deleted

## 2013-05-14 NOTE — Telephone Encounter (Signed)
Left message for pt to return my call so I can reschedule her genetic appt. 

## 2013-05-15 ENCOUNTER — Telehealth: Payer: Self-pay | Admitting: *Deleted

## 2013-05-15 NOTE — Telephone Encounter (Signed)
Pt returned my call and I informed her that Clydie BraunKaren was no longer here and I needed to reschedule her genetic appt. Confirmed 05/21/13 appt w pt.

## 2013-05-21 ENCOUNTER — Encounter: Payer: Self-pay | Admitting: Genetic Counselor

## 2013-05-21 ENCOUNTER — Other Ambulatory Visit: Payer: BC Managed Care – PPO

## 2013-05-21 ENCOUNTER — Ambulatory Visit (HOSPITAL_BASED_OUTPATIENT_CLINIC_OR_DEPARTMENT_OTHER): Payer: BC Managed Care – PPO | Admitting: Genetic Counselor

## 2013-05-21 DIAGNOSIS — Z808 Family history of malignant neoplasm of other organs or systems: Secondary | ICD-10-CM | POA: Insufficient documentation

## 2013-05-21 DIAGNOSIS — Z803 Family history of malignant neoplasm of breast: Secondary | ICD-10-CM | POA: Insufficient documentation

## 2013-05-21 DIAGNOSIS — Z800 Family history of malignant neoplasm of digestive organs: Secondary | ICD-10-CM | POA: Insufficient documentation

## 2013-05-21 NOTE — Progress Notes (Signed)
HISTORY OF PRESENT ILLNESS: Ms. Lonni FixMunrow, a 33 y.o. female, was seen for a cancer genetics consultation due to a family history of cancer.  Ms. Bennye AlmMunroe presents to clinic today to discuss the possibility of a hereditary predisposition to cancer, genetic testing, and to further clarify her future cancer risks, as well as potential cancer risk for family members. Ms. Bennye AlmMunroe has no personal history of cancer.   Past Medical History  Diagnosis Date   History of chicken pox    No pertinent past medical history     Past Surgical History  Procedure Laterality Date   Tonsillectomy     Wisdom tooth extraction      History   Social History   Marital Status: Married    Spouse Name: N/A    Number of Children: N/A   Years of Education: N/A   Social History Main Topics   Smoking status: Never Smoker    Smokeless tobacco: Never Used   Alcohol Use: No   Drug Use: No   Sexual Activity: Yes   Other Topics Concern   Not on file   Social History Narrative   No narrative on file     FAMILY HISTORY:  During the visit, a 4-generation pedigree was obtained. Significant diagnoses include the following:  Family History  Problem Relation Age of Onset   Cancer Mother 1040    breast   Vision loss Mother     cataract   Hyperlipidemia Father    Hypertension Father    Cancer Sister 1138    thyroid   Miscarriages / Stillbirths Sister    Asthma Brother    Cancer Maternal Uncle 40    colon   Cancer Maternal Grandmother 3875    breast   Diabetes Maternal Grandfather     Ms. Ascencio's ancestry is of Caucasian descent. There is no known Jewish ancestry or consanguinity.  GENETIC COUNSELING ASSESSMENT: Ms. Bennye AlmMunroe is a 33 y.o. female with a family history of cancer suggestive of a hereditary predisposition to cancer. We, therefore, discussed and recommended the following at today's visit.   DISCUSSION: We reviewed the characteristics, features and inheritance patterns of hereditary  cancer syndromes. We also discussed genetic testing, including the appropriate family members to test, the process of testing, insurance coverage and turn-around-time for results. We discussed the implications of a negative, positive and/or variant of uncertain significant result. We recommended Ms. Hanton pursue genetic testing for the OvaNext gene panel, which includes testing ofr genes associated with breast, thyroid and colon cancers.   PLAN: Based on our above recommendation, Ms. Bennye AlmMunroe wished to pursue genetic testing and the blood sample was drawn and will be sent to [facility] for analysis of the OvaNext gene panel. Results should be available within approximately 4 weeks time, at which point they will be disclosed by telephone to Ms. Swartz, as will any additional recommendations warranted by these results. Lastly, we encouraged Ms. Ponciano to remain in contact with cancer genetics annually so that we can continuously update the family history and inform her of any changes in cancer genetics and testing that may be of benefit for this family. Ms.  Mordecai RasmussenMunroe's questions were answered to her satisfaction today. Our contact information was provided should additional questions or concerns arise.   Thank you for the referral and allowing us to share in the care of your patient.   The patient was seen for a total of 45 minutes, greater than 50% of which was spent face-to-face counseling.  This patient was discussed with Welton FlakesKhan who agrees with the above.    _______________________________________________________________________ For Office Staff:  Number of people involved in session: 4 Was an Intern/ student involved with case: no

## 2013-05-31 ENCOUNTER — Other Ambulatory Visit: Payer: BC Managed Care – PPO

## 2013-05-31 ENCOUNTER — Encounter: Payer: BC Managed Care – PPO | Admitting: Genetic Counselor

## 2013-06-22 ENCOUNTER — Encounter: Payer: Self-pay | Admitting: Genetic Counselor

## 2013-06-22 DIAGNOSIS — Z808 Family history of malignant neoplasm of other organs or systems: Secondary | ICD-10-CM

## 2013-06-22 DIAGNOSIS — Z803 Family history of malignant neoplasm of breast: Secondary | ICD-10-CM

## 2013-06-22 DIAGNOSIS — Z800 Family history of malignant neoplasm of digestive organs: Secondary | ICD-10-CM

## 2013-06-22 NOTE — Progress Notes (Signed)
HPI:  Ms. Hogan was previously seen in the [place] clinic due to a family history of cancer and concerns regarding a hereditary predisposition to cancer. Please refer to our prior cancer genetics clinic note for more information regarding Ms. Casares's medical, social and family histories, and our assessment and recommendations, at the time. Ms. Tow recent genetic test results were disclosed to her, as were recommendations warranted by these results. These results and recommendations are discussed in more detail below.  GENETIC TEST RESULTS: At the time of Ms. Sawin's visit, we recommended she pursue genetic testing of the OvaNext gene panel. This test, which included sequencing and deletion/duplication analysis of the genes listed on the test report, was performed at [facility]. Genetic testing was normal, and did not reveal a pathogenic mutation in any these genes. A complete list of all genes tested is located on the test report scanned into [employer].  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 At this time, it is unknown if this variant is associated with an increased risk for cancer or if this is a normal finding. With time, we suspect the lab will reclassify this variant and when they do, we will try to re-contact Ms. Trieu to discuss the reclassification further.   We discussed with Ms. Benninger that since the current genetic testing is not perfect, it is possible there may be a gene mutation in one of these genes that current testing cannot detect, but that chance is small.  We also discussed, that it is possible that another gene that has not yet been discovered, or that we have not yet tested, is responsible for the cancer diagnoses in the family, and it is, therefore, important to remain in touch with cancer genetics in the future so that we can continue to offer Ms. Sopp the most up to date genetic testing.   CANCER  SCREENING RECOMMENDATIONS: his normal result is reassuring and indicates that Ms. Lessig does not likely have an increased risk of cancer due to a a mutation in one of these genes.  We, therefore, recommended  Ms. Harshfield continue to follow the cancer screening guidelines provided by her primary healthcare providers.   RECOMMENDATIONS FOR FAMILY MEMBERS:  Women in this family might be at some increased risk of developing cancer, over the general population risk, simply due to the family history of cancer.  We recommended women in this family have a yearly mammogram beginning at age 76 (44 years prior to the earliest breast cancer diagnosis), an an annual clinical breast exam, and perform monthly breast self-exams. Women in this family should also have a gynecological exam as recommended by their primary provider. All family members should have a colonoscopy beginning at age 6 (34 years prior to the earliest colon cancer diagnosis).  FOLLOW-UP: Lastly, we discussed with Ms. Warsame that cancer genetics is a rapidly advancing field and it is possible that new genetic tests will be appropriate for her and/or her family members in the future. We encouraged her to remain in contact with cancer genetics on an annual basis so we can update her personal and family histories and let her know of advances in cancer genetics that may benefit this family.   Our contact number was provided. Ms. Cid questions were answered to her satisfaction, and she knows she is welcome to call us at anytime with additional questions or concerns. This patient was discussed with Dr. Humphrey Rolls who agrees with the  above.   Carina A. Fine, MS, CGC Certified Genetic Counseor phone: [phone] [email or web address]

## 2013-11-19 ENCOUNTER — Encounter: Payer: Self-pay | Admitting: Genetic Counselor

## 2017-04-21 DIAGNOSIS — S161XXA Strain of muscle, fascia and tendon at neck level, initial encounter: Secondary | ICD-10-CM | POA: Diagnosis not present

## 2017-11-02 DIAGNOSIS — Z23 Encounter for immunization: Secondary | ICD-10-CM | POA: Diagnosis not present

## 2017-12-06 DIAGNOSIS — J019 Acute sinusitis, unspecified: Secondary | ICD-10-CM | POA: Diagnosis not present

## 2017-12-06 DIAGNOSIS — J309 Allergic rhinitis, unspecified: Secondary | ICD-10-CM | POA: Diagnosis not present
